# Patient Record
Sex: Male | Born: 1965 | Race: Black or African American | Hispanic: No | Marital: Single
Health system: Southern US, Community
[De-identification: ages and names within clinical notes are randomized; demographics above are authoritative.]

---

## 1999-08-14 ENCOUNTER — Emergency Department (HOSPITAL_COMMUNITY): Admission: EM | Admit: 1999-08-14 | Discharge: 1999-08-14 | Payer: Self-pay | Admitting: Emergency Medicine

## 2020-07-12 ENCOUNTER — Emergency Department (HOSPITAL_COMMUNITY): Payer: HRSA Program

## 2020-07-12 ENCOUNTER — Emergency Department (HOSPITAL_COMMUNITY)
Admission: EM | Admit: 2020-07-12 | Discharge: 2020-07-12 | Disposition: A | Discharge status: Home / Self Care | Payer: HRSA Program | Attending: Emergency Medicine | Admitting: Emergency Medicine

## 2020-07-12 ENCOUNTER — Encounter (HOSPITAL_COMMUNITY): Payer: Self-pay

## 2020-07-12 ENCOUNTER — Other Ambulatory Visit: Payer: Self-pay

## 2020-07-12 DIAGNOSIS — R059 Cough, unspecified: Secondary | ICD-10-CM | POA: Diagnosis present

## 2020-07-12 DIAGNOSIS — U071 COVID-19: Secondary | ICD-10-CM | POA: Diagnosis not present

## 2020-07-12 LAB — CBC WITH DIFFERENTIAL/PLATELET
Abs Immature Granulocytes: 0 10*3/uL (ref 0.00–0.07)
Basophils Absolute: 0 10*3/uL (ref 0.0–0.1)
Basophils Relative: 0 %
Eosinophils Absolute: 0 10*3/uL (ref 0.0–0.5)
Eosinophils Relative: 0 %
HCT: 44.2 % (ref 39.0–52.0)
Hemoglobin: 15.2 g/dL (ref 13.0–17.0)
Lymphocytes Relative: 27 %
Lymphs Abs: 1.1 10*3/uL (ref 0.7–4.0)
MCH: 33.5 pg (ref 26.0–34.0)
MCHC: 34.4 g/dL (ref 30.0–36.0)
MCV: 97.4 fL (ref 80.0–100.0)
Monocytes Absolute: 0.3 10*3/uL (ref 0.1–1.0)
Monocytes Relative: 8 %
Neutro Abs: 2.5 10*3/uL (ref 1.7–7.7)
Neutrophils Relative %: 65 %
Platelets: 313 10*3/uL (ref 150–400)
RBC: 4.54 MIL/uL (ref 4.22–5.81)
RDW: 13 % (ref 11.5–15.5)
WBC: 3.9 10*3/uL — ABNORMAL LOW (ref 4.0–10.5)
nRBC: 0 % (ref 0.0–0.2)
nRBC: 0 /100 WBC

## 2020-07-12 LAB — BASIC METABOLIC PANEL
Anion gap: 13 (ref 5–15)
BUN: 14 mg/dL (ref 6–20)
CO2: 19 mmol/L — ABNORMAL LOW (ref 22–32)
Calcium: 9.3 mg/dL (ref 8.9–10.3)
Chloride: 106 mmol/L (ref 98–111)
Creatinine, Ser: 0.85 mg/dL (ref 0.61–1.24)
GFR, Estimated: >60 mL/min (ref >60)
Glucose, Bld: 139 mg/dL — ABNORMAL HIGH (ref 70–99)
Potassium: 4 mmol/L (ref 3.5–5.1)
Sodium: 138 mmol/L (ref 135–145)

## 2020-07-12 LAB — SARS CORONAVIRUS 2 BY RT PCR (HOSPITAL ORDER, PERFORMED IN ~~LOC~~ HOSPITAL LAB): SARS Coronavirus 2: POSITIVE — AB

## 2020-07-12 MED ORDER — BENZONATATE 100 MG PO CAPS: 100.0000 mg | ORAL_CAPSULE | Freq: Three times a day (TID) | ORAL | 0 refills | Status: AC

## 2020-07-12 MED ORDER — PROMETHAZINE-DM 6.25-15 MG/5ML PO SYRP: 5.0000 mL | ORAL_SOLUTION | Freq: Four times a day (QID) | ORAL | 0 refills | Status: AC | PRN

## 2020-07-12 MED ORDER — METHOCARBAMOL 500 MG PO TABS: 500.0000 mg | ORAL_TABLET | Freq: Two times a day (BID) | ORAL | 0 refills | Status: AC

## 2020-07-12 MED ORDER — FLUTICASONE PROPIONATE 50 MCG/ACT NA SUSP: 2.0000 | Freq: Every day | NASAL | 0 refills | Status: AC

## 2020-07-12 MED ORDER — ONDANSETRON 4 MG PO TBDP: 4.0000 mg | ORAL_TABLET | Freq: Three times a day (TID) | ORAL | 0 refills | Status: AC | PRN

## 2020-07-12 NOTE — Discharge Instructions (Addendum)
You have been diagnosed with COVID-19.  Please drink plenty of water, use Tylenol and ibuprofen as written below. Please use Tylenol or ibuprofen for pain.  You may use 600 mg ibuprofen every 6 hours or 1000 mg of Tylenol every 6 hours.  You may choose to alternate between the 2.  This would be most effective.  Not to exceed 4 g of Tylenol within 24 hours.  Not to exceed 3200 mg ibuprofen 24 hours.  I also prescribed you 2 different cough medications as well as Flonase which is a nasal spray.  I have also prescribed you Robaxin which is a muscle relaxant use this for muscle aches so make you very drowsy so please only uses at nighttime or when you are not operating heavy machinery.   I prescribed you Zofran for nausea should you develop this. You do not necessarily need to take this unless you begin having nausea or vomiting.

## 2020-07-12 NOTE — ED Triage Notes (Signed)
Patient complains of 2 weeks of cough, congestion and weakness. Patient had 1 dose of vaccine. Patient complains of general weakness

## 2020-07-12 NOTE — ED Provider Notes (Signed)
[facility] EMERGENCY DEPARTMENT Provider Note   CSN: [id number] Arrival date & time: 07/12/20  1300     History No chief complaint on file.   Omar Powell is a 55 y.o. male.  HPI Patient is a 55 year old gentleman with no past medical history presented today with 2 weeks of cough, congestion, fatigue and generalized weakness.  He states he has been vaccinated with 1 dose of the vaccine.  He states that he came to the ER because of the persistent cough he has had.  He has been using Mucinex, NyQuil and DayQuil but states that his symptoms are only temporarily relieved with these medications.  He has not taken any other over-the-counter medication.  He does also endorse a sore throat and some runny nose.  He states his primary concern / sx today is that he needs something for his cough.   He denies any chest pain, shortness of breath, no lightheadedness or dizziness.      History reviewed. No pertinent past medical history.  There are no problems to display for this patient.   History reviewed. No pertinent surgical history.     No family history on file.     Home Medications Prior to Admission medications   Medication Sig Start Date End Date Taking? Authorizing Provider  benzonatate (TESSALON) 100 MG capsule Take 1 capsule (100 mg total) by mouth every 8 (eight) hours. 07/12/20  Yes Treson Laura S, PA  fluticasone (FLONASE) 50 MCG/ACT nasal spray Place 2 sprays into both nostrils daily for 14 days. 07/12/20 07/26/20 Yes Janaki Exley S, PA  methocarbamol (ROBAXIN) 500 MG tablet Take 1 tablet (500 mg total) by mouth 2 (two) times daily. 07/12/20  Yes Berry Godsey S, PA  ondansetron (ZOFRAN ODT) 4 MG disintegrating tablet Take 1 tablet (4 mg total) by mouth every 8 (eight) hours as needed for nausea or vomiting. 07/12/20  Yes Stevee Valenta, Stevphen Meuse S, PA  promethazine-dextromethorphan (PROMETHAZINE-DM) 6.25-15 MG/5ML syrup Take 5 mLs by mouth 4 (four) times daily as  needed for cough. 07/12/20  Yes Gailen Shelter, PA    Allergies    Patient has no allergy information on record.  Review of Systems   Review of Systems  Constitutional: Positive for chills and fatigue. Negative for fever.  HENT: Positive for congestion, rhinorrhea and sore throat. Negative for postnasal drip.   Eyes: Negative for redness.  Respiratory: Positive for cough. Negative for shortness of breath.   Cardiovascular: Negative for chest pain and leg swelling.  Gastrointestinal: Negative for abdominal pain, diarrhea, nausea and vomiting.  Endocrine: Negative for polyphagia.  Genitourinary: Negative for dysuria.  Musculoskeletal: Positive for myalgias.  Skin: Negative for rash.  Neurological: Negative for syncope and headaches.  Psychiatric/Behavioral: Negative for confusion.    Physical Exam Updated Vital Signs BP (!) 127/92   Pulse 84   Temp 98.6 F (37 C) (Oral)   Resp (!) 24   SpO2 100%   Physical Exam Vitals and nursing note reviewed.  Constitutional:      General: He is not in acute distress.    Comments: Pleasant well-appearing 55 year old.  In no acute distress.  Sitting comfortably in bed.  Able answer questions appropriately follow commands. No increased work of breathing. Speaking in full sentences.  HENT:     Head: Normocephalic and atraumatic.     Nose: Nose normal.  Eyes:     General: No scleral icterus. Cardiovascular:     Rate and Rhythm: Normal rate and  regular rhythm.     Pulses: Normal pulses.     Heart sounds: Normal heart sounds.  Pulmonary:     Effort: Pulmonary effort is normal. No respiratory distress.     Breath sounds: No wheezing.     Comments: Lungs are clear to auscultation all fields.  No increased work of breathing, speaking full sentences, no accessory muscle usage. Abdominal:     Palpations: Abdomen is soft.     Tenderness: There is no abdominal tenderness. There is no guarding or rebound.  Musculoskeletal:     Cervical back:  Normal range of motion.     Right lower leg: No edema.     Left lower leg: No edema.  Skin:    General: Skin is warm and dry.     Capillary Refill: Capillary refill takes less than 2 seconds.  Neurological:     Mental Status: He is alert. Mental status is at baseline.  Psychiatric:        Mood and Affect: Mood normal.        Behavior: Behavior normal.     ED Results / Procedures / Treatments   Labs (all labs ordered are listed, but only abnormal results are displayed) Labs Reviewed  SARS CORONAVIRUS 2 BY RT PCR (HOSPITAL ORDER, PERFORMED IN [facility]) - Abnormal; Notable for the following components:      Result Value   SARS Coronavirus 2 POSITIVE (*)    All other components within normal limits  CBC WITH DIFFERENTIAL/PLATELET - Abnormal; Notable for the following components:   WBC 3.9 (*)    All other components within normal limits  BASIC METABOLIC PANEL - Abnormal; Notable for the following components:   CO2 19 (*)    Glucose, Bld 139 (*)    All other components within normal limits    EKG None  Radiology DG Chest 2 View  Result Date: 07/12/2020 CLINICAL DATA:  Cough for 2 weeks Sore throat EXAM: CHEST - 2 VIEW COMPARISON:  None. FINDINGS: The heart size and mediastinal contours are within normal limits. Both lungs are clear. The visualized skeletal structures are unremarkable. IMPRESSION: No acute cardiopulmonary process. Electronically Signed   By: Acquanetta Belling M.D.   On: 07/12/2020 14:28    Procedures Procedures   Medications Ordered in ED Medications - No data to display  ED Course  I have reviewed the triage vital signs and the nursing notes.  Pertinent labs & imaging results that were available during my care of the patient were reviewed by me and considered in my medical decision making (see chart for details).    MDM Rules/Calculators/A&P                          Patient is a 55 year old gentleman with no pertinent past medical history  presented today for symptoms discussed in HPI  Primarily he is concerned about his cough.  He also does have runny nose, sore throat, fatigue.  Patient tested positive for COVID-19 today.  BMP without any significant electrolyte abnormalities.  CO2 mildly low at 19.  CBC with mild leukopenia consistent with COVID-19 no other abnormalities.  Chest x-ray unremarkable shows no acute abnormality.  This is consistent with patient having very mild respiratory symptoms he has had one vaccination.  Low suspicion for PE.  He is PERC negative.  And not having any chest pain or shortness of breath.  Doubt myocarditis for similar reasons.  We will discharge  patient with cough medicine Flonase, Robaxin for his muscle aches.  Patient is SPO2 remained between 96 and 100% on room air  Omar Powell was evaluated in Emergency Department on 07/12/2020 for the symptoms described in the history of present illness. He was evaluated in the context of the global COVID-19 pandemic, which necessitated consideration that the patient might be at risk for infection with the SARS-CoV-2 virus that causes COVID-19. Institutional protocols and algorithms that pertain to the evaluation of patients at risk for COVID-19 are in a state of rapid change based on information released by regulatory bodies including the CDC and federal and state organizations. These policies and algorithms were followed during the patient's care in the ED.  Final Clinical Impression(s) / ED Diagnoses Final diagnoses:  COVID-19    Rx / DC Orders ED Discharge Orders         Ordered    benzonatate (TESSALON) 100 MG capsule  Every 8 hours        07/12/20 1634    promethazine-dextromethorphan (PROMETHAZINE-DM) 6.25-15 MG/5ML syrup  4 times daily PRN        07/12/20 1634    ondansetron (ZOFRAN ODT) 4 MG disintegrating tablet  Every 8 hours PRN        07/12/20 1634    fluticasone (FLONASE) 50 MCG/ACT nasal spray  Daily        07/12/20 1634     methocarbamol (ROBAXIN) 500 MG tablet  2 times daily        07/12/20 1634           Gailen Shelter, [place] 07/12/20 1656    Virgina Norfolk, DO 07/13/20 1828

## 2020-07-13 ENCOUNTER — Telehealth: Payer: Self-pay | Admitting: *Deleted

## 2020-07-13 NOTE — Telephone Encounter (Signed)
Called to discuss with patient about COVID-19 symptoms and the use of one of the available treatments for those with mild to moderate Covid symptoms and at a high risk of hospitalization.  Pt appears to qualify for outpatient treatment due to co-morbid conditions and/or a member of an at-risk group in accordance with the FDA Emergency Use Authorization.    Symptom onset:  Vaccinated:  Booster?  Immunocompromised?  Qualifiers:   Unable to reach pt - No answer.   [place]   

## 2022-04-03 IMAGING — DX DG CHEST 2V
2 series · 2 of 2 positions shown · non-contrast
Comparison: None.

CLINICAL DATA: Cough for 2 weeks

Sore throat
EXAM:
CHEST - 2 VIEW

[chest pa]
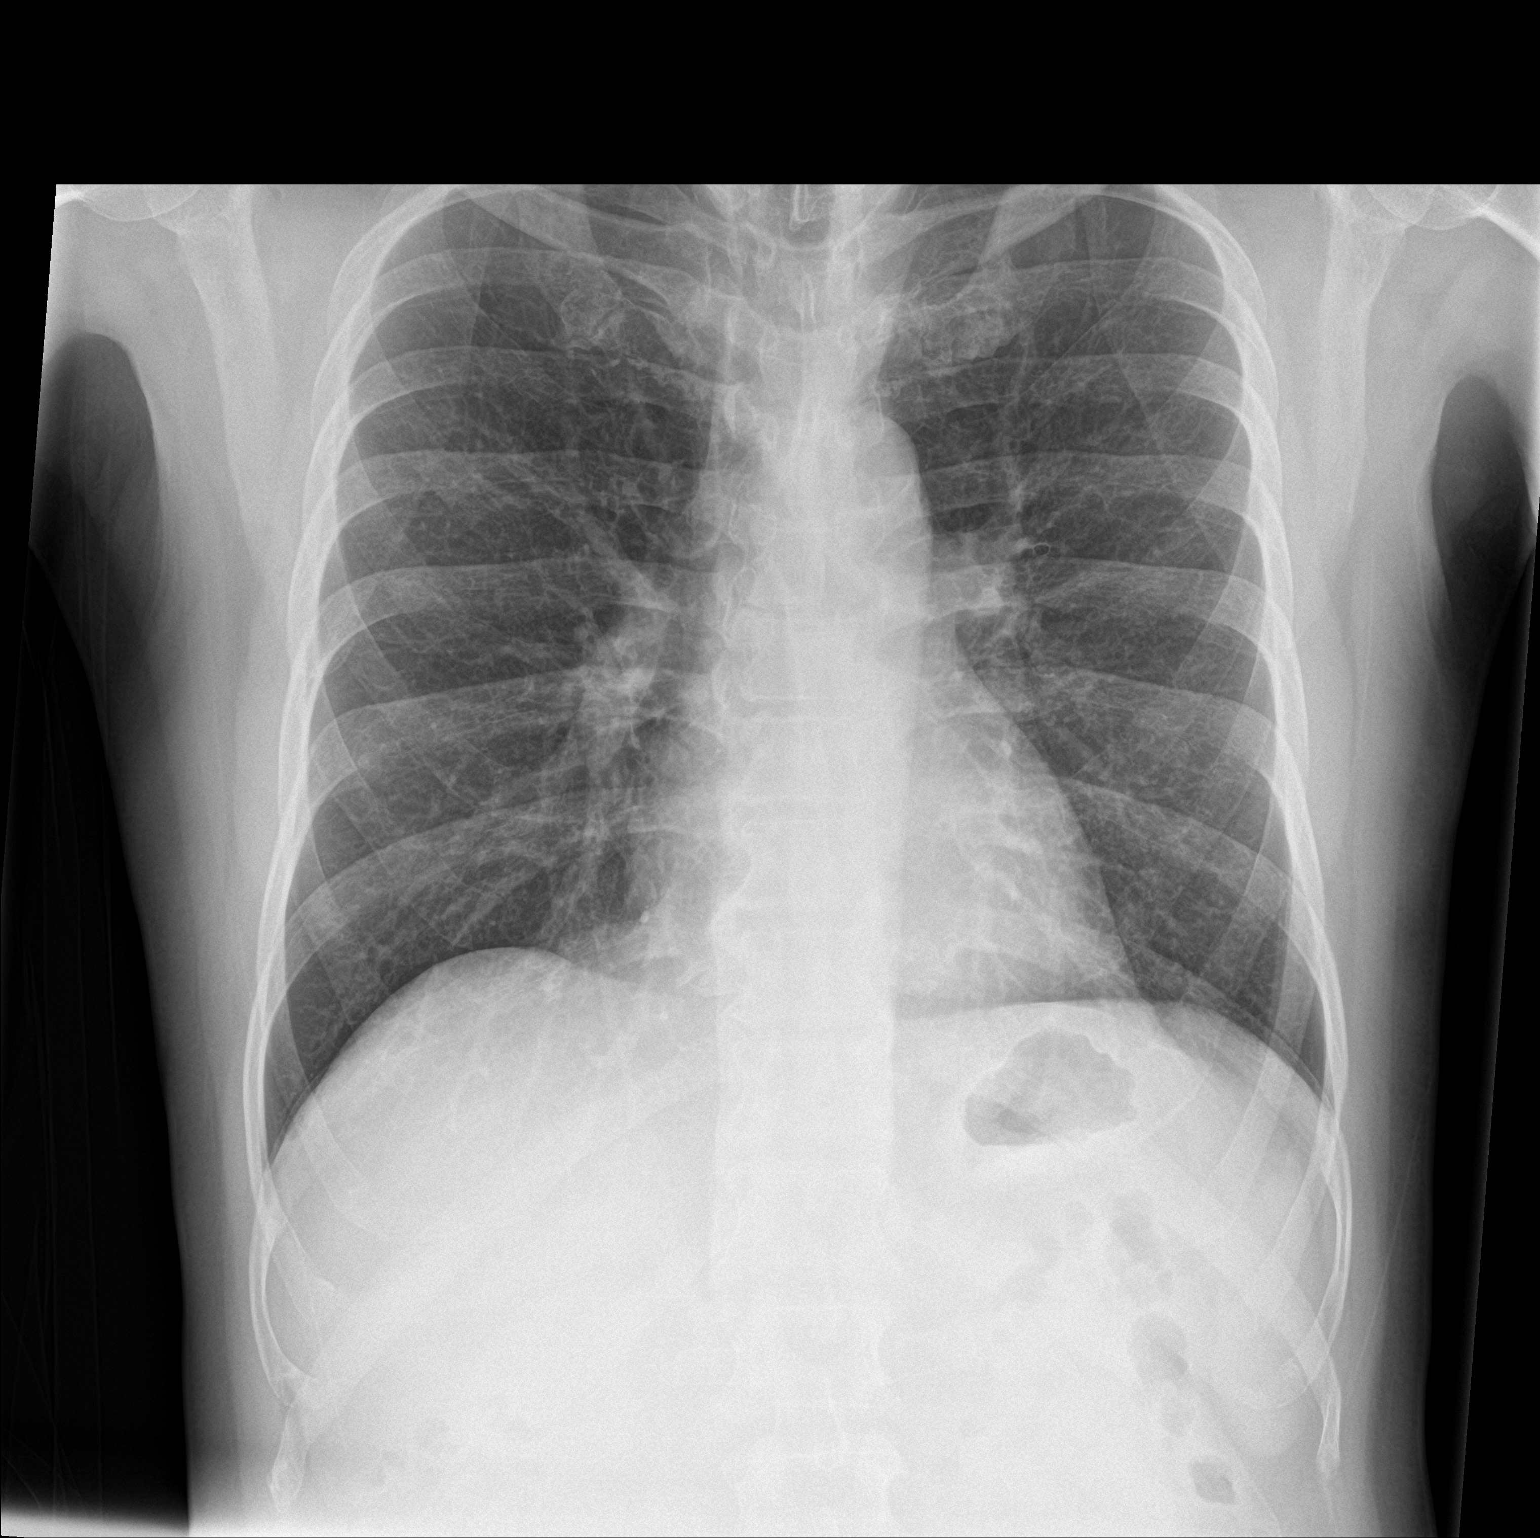

[chest lat]
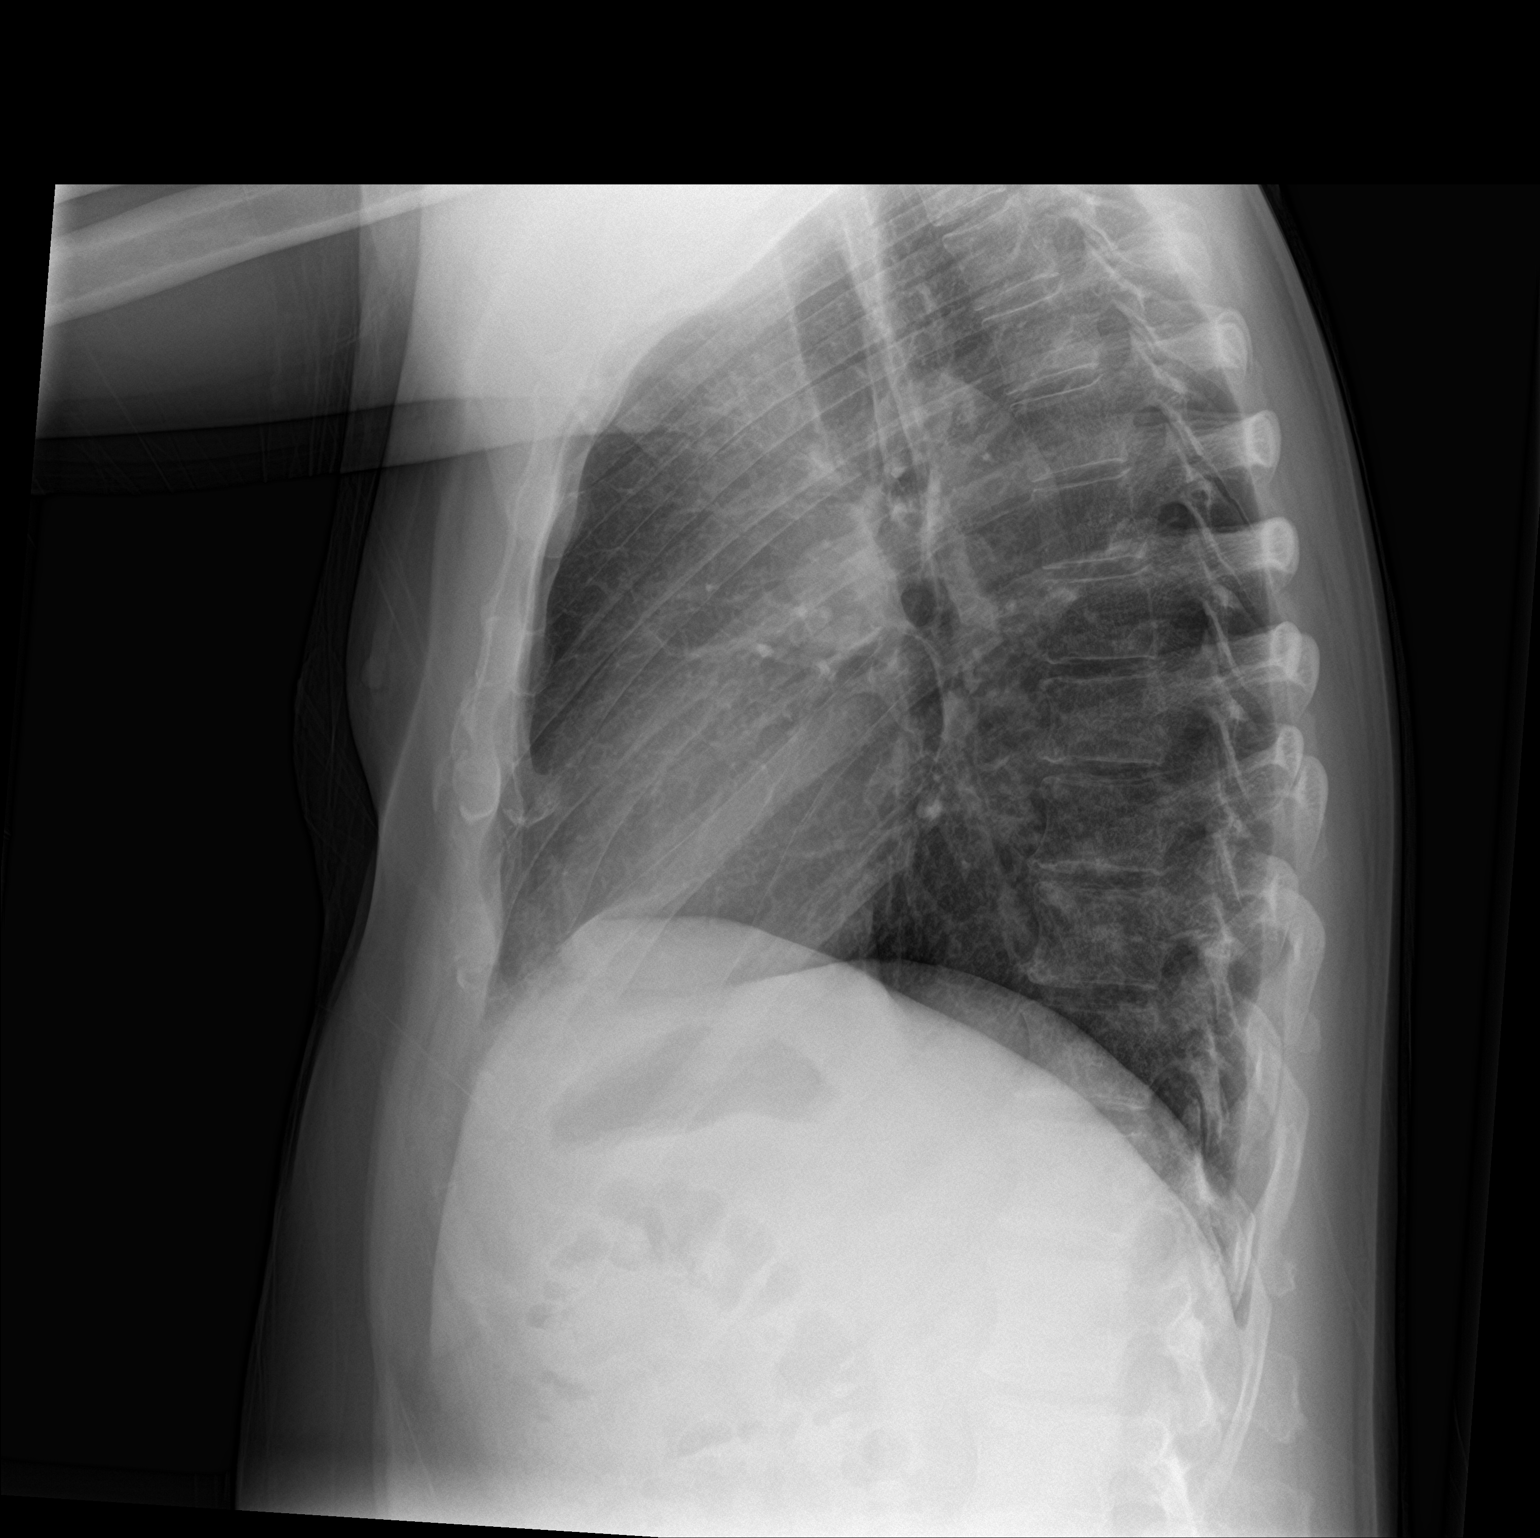

[2 of 2 positions shown; findings below may reference images not displayed]

FINDINGS: The heart size and mediastinal contours are within normal limits.
Both lungs are clear. The visualized skeletal structures are
unremarkable.
IMPRESSION: No acute cardiopulmonary process.

## 2022-04-28 ENCOUNTER — Emergency Department (HOSPITAL_COMMUNITY)
Admission: EM | Admit: 2022-04-28 | Discharge: 2022-04-28 | Disposition: A | Discharge status: Home / Self Care | Payer: Self-pay | Attending: Emergency Medicine | Admitting: Emergency Medicine

## 2022-04-28 ENCOUNTER — Emergency Department (HOSPITAL_COMMUNITY): Payer: Self-pay

## 2022-04-28 ENCOUNTER — Encounter (HOSPITAL_COMMUNITY): Payer: Self-pay

## 2022-04-28 ENCOUNTER — Other Ambulatory Visit: Payer: Self-pay

## 2022-04-28 DIAGNOSIS — I10 Essential (primary) hypertension: Secondary | ICD-10-CM | POA: Insufficient documentation

## 2022-04-28 DIAGNOSIS — I160 Hypertensive urgency: Secondary | ICD-10-CM | POA: Insufficient documentation

## 2022-04-28 DIAGNOSIS — R519 Headache, unspecified: Secondary | ICD-10-CM

## 2022-04-28 LAB — CBC WITH DIFFERENTIAL/PLATELET
Abs Immature Granulocytes: 0.01 10*3/uL (ref 0.00–0.07)
Basophils Absolute: 0 10*3/uL (ref 0.0–0.1)
Basophils Relative: 0 %
Eosinophils Absolute: 0 10*3/uL (ref 0.0–0.5)
Eosinophils Relative: 1 %
HCT: 35.9 % — ABNORMAL LOW (ref 39.0–52.0)
Hemoglobin: 13.1 g/dL (ref 13.0–17.0)
Immature Granulocytes: 0 %
Lymphocytes Relative: 24 %
Lymphs Abs: 1.2 10*3/uL (ref 0.7–4.0)
MCH: 39.3 pg — ABNORMAL HIGH (ref 26.0–34.0)
MCHC: 36.5 g/dL — ABNORMAL HIGH (ref 30.0–36.0)
MCV: 107.8 fL — ABNORMAL HIGH (ref 80.0–100.0)
Monocytes Absolute: 0.3 10*3/uL (ref 0.1–1.0)
Monocytes Relative: 7 %
Neutro Abs: 3.3 10*3/uL (ref 1.7–7.7)
Neutrophils Relative %: 68 %
Platelets: 285 10*3/uL (ref 150–400)
RBC: 3.33 MIL/uL — ABNORMAL LOW (ref 4.22–5.81)
RDW: 14.6 % (ref 11.5–15.5)
WBC: 4.9 10*3/uL (ref 4.0–10.5)
nRBC: 0 % (ref 0.0–0.2)

## 2022-04-28 LAB — BASIC METABOLIC PANEL
Anion gap: 13 (ref 5–15)
BUN: 11 mg/dL (ref 6–20)
CO2: 18 mmol/L — ABNORMAL LOW (ref 22–32)
Calcium: 8.8 mg/dL — ABNORMAL LOW (ref 8.9–10.3)
Chloride: 106 mmol/L (ref 98–111)
Creatinine, Ser: 0.89 mg/dL (ref 0.61–1.24)
GFR, Estimated: >60 mL/min (ref >60)
Glucose, Bld: 83 mg/dL (ref 70–99)
Potassium: 4.1 mmol/L (ref 3.5–5.1)
Sodium: 137 mmol/L (ref 135–145)

## 2022-04-28 MED ORDER — LACTATED RINGERS IV BOLUS
1000.0000 mL | Freq: Once | INTRAVENOUS | Status: AC
  Administered 2022-04-28: 1000 mL via INTRAVENOUS

## 2022-04-28 MED ORDER — DIPHENHYDRAMINE HCL 50 MG/ML IJ SOLN
25.0000 mg | Freq: Once | INTRAMUSCULAR | Status: AC
  Administered 2022-04-28: 25 mg via INTRAVENOUS
  Filled 2022-04-28: qty 1

## 2022-04-28 MED ORDER — METOCLOPRAMIDE HCL 5 MG/ML IJ SOLN
10.0000 mg | Freq: Once | INTRAMUSCULAR | Status: AC
  Administered 2022-04-28: 10 mg via INTRAVENOUS
  Filled 2022-04-28: qty 2

## 2022-04-28 MED ORDER — AMLODIPINE BESYLATE 2.5 MG PO TABS: 2.5000 mg | ORAL_TABLET | Freq: Every day | ORAL | 1 refills | Status: AC

## 2022-04-28 NOTE — ED Triage Notes (Signed)
Patient called EMS this morning for dizziness, pins, and needles, his BP was over 200/systolic. Patient has not been to the MD in over 10 years, and does not take any home medications.   States when he went to bed last night he felt totally fine at 2200, then at 0500 this morning, he felt dizzy.

## 2022-04-28 NOTE — ED Provider Notes (Signed)
[facility] EMERGENCY DEPARTMENT Provider Note   CSN: [id number] Arrival date & time: 04/28/22  1145     History  Chief Complaint  Patient presents with   Hypertension    Omar Powell is a 56 y.o. male.  HPI 56 year old male presents with left-sided headache/facial pain.  Started this morning around 5 PM.  Went to bed last night around 10 PM and was fine.  When he woke up he was having the headache/facial pain.  It was severe in onset but also has progressively worsened in severity throughout the day.  He took Tylenol with no relief.  He did feel lightheaded and saw some spots in his vision when this first started.  He denies any room spinning sensation.  No focal weakness or numbness.  He has broken his neck in the past and states he has chronic issues with arm pain on the left side and leg pain on the left side and its not new or worse or different today.  No chest pain or shortness of breath.  Home Medications Prior to Admission medications   Medication Sig Start Date End Date Taking? Authorizing Provider  amLODipine (NORVASC) 2.5 MG tablet Take 1 tablet (2.5 mg total) by mouth daily. 04/28/22  Yes Pricilla Loveless, MD  benzonatate (TESSALON) 100 MG capsule Take 1 capsule (100 mg total) by mouth every 8 (eight) hours. 07/12/20   Gailen Shelter, PA  fluticasone (FLONASE) 50 MCG/ACT nasal spray Place 2 sprays into both nostrils daily for 14 days. 07/12/20 07/26/20  Gailen Shelter, PA  methocarbamol (ROBAXIN) 500 MG tablet Take 1 tablet (500 mg total) by mouth 2 (two) times daily. 07/12/20   Gailen Shelter, PA  ondansetron (ZOFRAN ODT) 4 MG disintegrating tablet Take 1 tablet (4 mg total) by mouth every 8 (eight) hours as needed for nausea or vomiting. 07/12/20   Gailen Shelter, PA  promethazine-dextromethorphan (PROMETHAZINE-DM) 6.25-15 MG/5ML syrup Take 5 mLs by mouth 4 (four) times daily as needed for cough. 07/12/20   Gailen Shelter, PA      Allergies     Patient has no known allergies.    Review of Systems   Review of Systems  HENT:  Negative for dental problem.   Cardiovascular:  Negative for chest pain.  Gastrointestinal:  Negative for vomiting.  Musculoskeletal:  Negative for neck pain.  Neurological:  Positive for headaches. Negative for weakness and numbness.    Physical Exam Updated Vital Signs BP (!) 195/80 (BP Location: Right Arm)   Pulse 68   Temp 98.1 F (36.7 C) (Oral)   Resp 16   Ht 6' (1.829 m)   Wt 77.1 kg   SpO2 99%   BMI 23.06 kg/m  Physical Exam Vitals and nursing note reviewed.  Constitutional:      General: He is not in acute distress.    Appearance: He is well-developed. He is not ill-appearing or diaphoretic.  HENT:     Head: Normocephalic and atraumatic.     Comments: No facial swelling appreciated Cardiovascular:     Rate and Rhythm: Normal rate and regular rhythm.     Pulses:          Radial pulses are 2+ on the left side.     Heart sounds: Normal heart sounds.  Pulmonary:     Effort: Pulmonary effort is normal.     Breath sounds: Normal breath sounds.  Abdominal:     Palpations: Abdomen is soft.  Tenderness: There is no abdominal tenderness.  Skin:    General: Skin is warm and dry.  Neurological:     Mental Status: He is alert.     Comments: CN 3-12 grossly intact. 5/5 strength in all 4 extremities. Grossly normal sensation. Normal finger to nose.      ED Results / Procedures / Treatments   Labs (all labs ordered are listed, but only abnormal results are displayed) Labs Reviewed  CBC WITH DIFFERENTIAL/PLATELET - Abnormal; Notable for the following components:      Result Value   RBC 3.33 (*)    HCT 35.9 (*)    MCV 107.8 (*)    MCH 39.3 (*)    MCHC 36.5 (*)    All other components within normal limits  BASIC METABOLIC PANEL - Abnormal; Notable for the following components:   CO2 18 (*)    Calcium 8.8 (*)    All other components within normal limits    EKG EKG  Interpretation  Date/Time:  Sunday April 28 2022 11:48:13 EST Ventricular Rate:  64 PR Interval:  129 QRS Duration: 89 QT Interval:  385 QTC Calculation: 398 R Axis:   69 Text Interpretation: Sinus rhythm Supraventricular bigeminy Consider left ventricular hypertrophy No old tracing to compare Confirmed by Pricilla Loveless [phone]) on 04/28/2022 12:45:09 PM  Radiology CT Head Wo Contrast  Result Date: 04/28/2022 CLINICAL DATA:  Headache. EXAM: CT HEAD WITHOUT CONTRAST TECHNIQUE: Contiguous axial images were obtained from the base of the skull through the vertex without intravenous contrast. RADIATION DOSE REDUCTION: This exam was performed according to the departmental dose-optimization program which includes automated exposure control, adjustment of the mA and/or kV according to patient size and/or use of iterative reconstruction technique. COMPARISON:  None Available. FINDINGS: Brain: No evidence of acute infarction, hemorrhage, hydrocephalus, extra-axial collection or mass lesion/mass effect. Vascular: No hyperdense vessel or unexpected calcification. Skull: Normal. Negative for fracture or focal lesion. Sinuses/Orbits: Bilateral maxillary sinus mucosal thickening. Other: None. IMPRESSION: No acute intracranial pathology. Electronically Signed   By: Lorenza Cambridge M.D.   On: 04/28/2022 12:54    Procedures Procedures    Medications Ordered in ED Medications  metoCLOPramide (REGLAN) injection 10 mg (10 mg Intravenous Given 04/28/22 1320)  diphenhydrAMINE (BENADRYL) injection 25 mg (25 mg Intravenous Given 04/28/22 1320)  lactated ringers bolus 1,000 mL (0 mLs Intravenous Stopped 04/28/22 1500)    ED Course/ Medical Decision Making/ A&P                           Medical Decision Making Amount and/or Complexity of Data Reviewed External Data Reviewed: notes. Labs: ordered. Decision-making details documented in ED Course.    Details: Normal WBC, hemoglobin and no significant  electrolyte disturbance.  Has a low bicarbonate that was present in the past without acidosis. Radiology: ordered and independent interpretation performed.    Details: No head bleed or masses on head CT. ECG/medicine tests: independent interpretation performed.    Details: Likely LVH.  Risk Prescription drug management.   Patient with a nonspecific left-sided headache.  Started this morning.  No visual complaints and the pain is diffuse on the left side of his head and face so I think temporal arteritis is less likely.  He is feeling better after Reglan and Benadryl and fluids.  Head CT is unremarkable.  There is no real thunderclap component and it gradually worsened throughout the morning so I think subarachnoid hemorrhage is pretty unlikely.  His he was pretty hypertensive on arrival though with talking with family and patient he has not seen a doctor in quite some time and is unclear how long that has been ongoing.  I doubt this is a hypertensive emergency.  His blood pressure has slowly improved and most recent check on my evaluation was around 170 systolic.  I do not think he needs emergent IV treatment though I did discuss with him and family and he wants to start on oral medications and I have highly recommended he follow-up with a PCP.  He otherwise is feeling better and I have low suspicion for temporal arteritis, subarachnoid hemorrhage, stroke, head meningitis, or any other emergent pathology.  Will discharge home with a prescription for amlodipine and with return precautions.        Final Clinical Impression(s) / ED Diagnoses Final diagnoses:  Hypertensive urgency  Left-sided headache    Rx / DC Orders ED Discharge Orders          Ordered    amLODipine (NORVASC) 2.5 MG tablet  Daily        04/28/22 1438              Pricilla Loveless, MD 04/28/22 1532

## 2022-04-28 NOTE — Discharge Instructions (Addendum)
You are being started on a blood pressure medicine to help with your blood pressure.  However is very important to follow-up with a primary care physician for further outpatient management of blood pressure which can be a chronic problem and can lead to severe illnesses such as heart attack, stroke, kidney failure, etc.  It is unclear what was causing your headache today.  You may take ibuprofen and Tylenol for pain.  However if you develop new or worsening headache, fevers, vomiting, vision changes, weakness or numbness, or any other new/concerning symptoms then return to the ER or call 911.

## 2022-07-25 ENCOUNTER — Ambulatory Visit: Payer: Self-pay | Admitting: Internal Medicine
